# Patient Record
Sex: Female | Born: 1950 | Race: Black or African American | Hispanic: No | Marital: Single | Smoking: Current every day smoker
Health system: Southern US, Community
[De-identification: ages and names within clinical notes are randomized; demographics above are authoritative.]

## PROBLEM LIST (undated history)

## (undated) DIAGNOSIS — E119 Type 2 diabetes mellitus without complications: Secondary | ICD-10-CM

## (undated) DIAGNOSIS — I10 Essential (primary) hypertension: Secondary | ICD-10-CM

## (undated) DIAGNOSIS — E7800 Pure hypercholesterolemia, unspecified: Secondary | ICD-10-CM

## (undated) HISTORY — PX: ABDOMINAL HYSTERECTOMY: SHX81

---

## 2017-02-27 ENCOUNTER — Encounter: Payer: Self-pay | Admitting: Emergency Medicine

## 2017-02-27 ENCOUNTER — Emergency Department (HOSPITAL_BASED_OUTPATIENT_CLINIC_OR_DEPARTMENT_OTHER): Payer: BLUE CROSS/BLUE SHIELD

## 2017-02-27 ENCOUNTER — Emergency Department (HOSPITAL_BASED_OUTPATIENT_CLINIC_OR_DEPARTMENT_OTHER)
Admission: EM | Admit: 2017-02-27 | Discharge: 2017-02-27 | Disposition: A | Discharge status: Home / Self Care | Payer: BLUE CROSS/BLUE SHIELD | Attending: Physician Assistant | Admitting: Physician Assistant

## 2017-02-27 DIAGNOSIS — Z87891 Personal history of nicotine dependence: Secondary | ICD-10-CM | POA: Diagnosis not present

## 2017-02-27 DIAGNOSIS — E119 Type 2 diabetes mellitus without complications: Secondary | ICD-10-CM | POA: Insufficient documentation

## 2017-02-27 DIAGNOSIS — Z7984 Long term (current) use of oral hypoglycemic drugs: Secondary | ICD-10-CM | POA: Insufficient documentation

## 2017-02-27 DIAGNOSIS — Z79899 Other long term (current) drug therapy: Secondary | ICD-10-CM | POA: Diagnosis not present

## 2017-02-27 DIAGNOSIS — M545 Low back pain: Secondary | ICD-10-CM | POA: Diagnosis not present

## 2017-02-27 DIAGNOSIS — M549 Dorsalgia, unspecified: Secondary | ICD-10-CM

## 2017-02-27 DIAGNOSIS — M5442 Lumbago with sciatica, left side: Secondary | ICD-10-CM

## 2017-02-27 DIAGNOSIS — I10 Essential (primary) hypertension: Secondary | ICD-10-CM | POA: Insufficient documentation

## 2017-02-27 HISTORY — DX: Pure hypercholesterolemia, unspecified: E78.00

## 2017-02-27 HISTORY — DX: Essential (primary) hypertension: I10

## 2017-02-27 HISTORY — DX: Type 2 diabetes mellitus without complications: E11.9

## 2017-02-27 MED ORDER — METHYLPREDNISOLONE 4 MG PO TBPK: ORAL_TABLET | ORAL | 0 refills | Status: AC

## 2017-02-27 MED ORDER — OXYCODONE-ACETAMINOPHEN 5-325 MG PO TABS: 1.0000 | ORAL_TABLET | Freq: Four times a day (QID) | ORAL | 0 refills | Status: AC | PRN

## 2017-02-27 NOTE — ED Provider Notes (Signed)
[facility] Provider Note   CSN: [id number] Arrival date & time: 02/27/17  1128     History   Chief Complaint Chief Complaint  Patient presents with  . Back Pain    HPI Paige [name] is a 66 y.o. female.  HPI   Patient is a very pleasant 66 year old female presenting with back pain. Patient has pain across the lower lumbar area. She's reports that all the way across. It's worse with work. Worse with movement. No fevers. Sometimes she reports that shoots down her left leg. Sometimes it does not. Patient is not an IV drug user no fevers no red flags.  Past Medical History:  Diagnosis Date  . Diabetes mellitus without complication (HCC)   . High cholesterol   . Hypertension     There are no active problems to display for this patient.   Past Surgical History:  Procedure Laterality Date  . ABDOMINAL HYSTERECTOMY      OB History    No data available       Home Medications    Prior to Admission medications   Medication Sig Start Date End Date Taking? Authorizing Provider  amLODipine (NORVASC) 5 MG tablet Take 5 mg by mouth daily.   Yes [provider]  atorvastatin (LIPITOR) 40 MG tablet Take 40 mg by mouth daily.   Yes [provider]  metFORMIN (GLUCOPHAGE) 1000 MG tablet Take 1,000 mg by mouth 2 (two) times daily with a meal.   Yes [provider]  potassium chloride (MICRO-K) 10 MEQ CR capsule Take 10 mEq by mouth 2 (two) times daily.   Yes [provider]    Family History No family history on file.  Social History Social History  Substance Use Topics  . Smoking status: Current Every Day Smoker  . Smokeless tobacco: Never Used  . Alcohol use Not on file     Allergies   Aspirin   Review of Systems Review of Systems  Constitutional: Negative for activity change.  Respiratory: Negative for shortness of breath.   Cardiovascular: Negative for chest pain.  Gastrointestinal: Negative for abdominal  pain.  Musculoskeletal: Positive for back pain.     Physical Exam Updated Vital Signs BP 136/81 (BP Location: Left Arm)   Pulse 84   Temp 97.9 F (36.6 C) (Oral)   Resp 20   Ht 5' (1.524 m)   Wt 92.1 kg (203 lb)   SpO2 100%   BMI 39.65 kg/m   Physical Exam  Constitutional: She is oriented to person, place, and time. She appears well-developed and well-nourished.  HENT:  Head: Normocephalic and atraumatic.  Eyes: Right eye exhibits no discharge.  Cardiovascular: Normal rate, regular rhythm and normal heart sounds.   No murmur heard. Pulmonary/Chest: Effort normal and breath sounds normal. She has no wheezes. She has no rales.  Abdominal: Soft. She exhibits no distension. There is no tenderness.  Musculoskeletal:  Bilateral lower extremities with 5 out of 5 strength. No numbness or tingling and steady gait.  Neurological: She is oriented to person, place, and time.  Skin: Skin is warm and dry. She is not diaphoretic.  Psychiatric: She has a normal mood and affect.  Nursing note and vitals reviewed.    ED Treatments / Results  Labs (all labs ordered are listed, but only abnormal results are displayed) Labs Reviewed - No data to display  EKG  EKG Interpretation None       Radiology No results found.  Procedures Procedures (including  critical care time)  Medications Ordered in ED Medications - No data to display   Initial Impression / Assessment and Plan / ED Course  I have reviewed the triage vital signs and the nursing notes.  Pertinent labs & imaging results that were available during my care of the patient were reviewed by me and considered in my medical decision making (see chart for details).     Atraumatic back pain with no red flags. Patient has history of diabetes but is well controlled with diet and metformin. We'll give Medrol Dosepak, follow-up with primary care physician.  Final Clinical Impressions(s) / ED Diagnoses   Final diagnoses:    Back pain    New Prescriptions New Prescriptions   No medications on file     Abelino Derrick, MD 02/27/17 1511

## 2017-02-27 NOTE — Discharge Instructions (Signed)
Please return with any fevers numbness, loss of bowel or bladder continence. Otherwise please follow-up with her primary care physician and sports medicine for help with your back pain.

## 2017-02-27 NOTE — ED Triage Notes (Signed)
Pt reports lower back pain x 2 weeks. ALso reports pain in right 2nd digit. Denies urinary symptoms.

## 2017-07-22 ENCOUNTER — Other Ambulatory Visit: Payer: Self-pay

## 2017-07-22 ENCOUNTER — Ambulatory Visit: Payer: BLUE CROSS/BLUE SHIELD | Attending: Orthopedic Surgery | Admitting: Physical Therapy

## 2017-07-22 ENCOUNTER — Encounter: Payer: Self-pay | Admitting: Physical Therapy

## 2017-07-22 DIAGNOSIS — M545 Low back pain: Secondary | ICD-10-CM | POA: Insufficient documentation

## 2017-07-22 DIAGNOSIS — R29898 Other symptoms and signs involving the musculoskeletal system: Secondary | ICD-10-CM | POA: Insufficient documentation

## 2017-07-22 DIAGNOSIS — M6281 Muscle weakness (generalized): Secondary | ICD-10-CM | POA: Insufficient documentation

## 2017-07-22 NOTE — Therapy (Signed)
[facility] Outpatient Rehabilitation [facility] [address]  [address] [place], [place], [address] Phone: [phone]   Fax:  [phone]  Physical Therapy Evaluation  Patient Details  Name: Paige [name] MRN: [id number] Date of Birth: 18-Feb-1951 Referring Provider: Dr. Ranee Gosselin   Encounter Date: 07/22/2017  PT End of Session - 07/22/17 1010    Visit Number  1    Number of Visits  12    Date for PT Re-Evaluation  09/02/17    PT Start Time  0935    PT Stop Time  1010    PT Time Calculation (min)  35 min    Activity Tolerance  Patient tolerated treatment well    Behavior During Therapy  [facility] for tasks assessed/performed       Past Medical History:  Diagnosis Date  . Diabetes mellitus without complication (HCC)   . High cholesterol   . Hypertension     Past Surgical History:  Procedure Laterality Date  . ABDOMINAL HYSTERECTOMY      There were no vitals filed for this visit.   Subjective Assessment - 07/22/17 0937    Subjective  Pateint reporting a fall on 06/20/17 - was at work, doesn't recall mechanism of fall. Reports difficulty getting up in the morning due to pain. Reports a history of herniated disc. Feels like B LE are really weak. Report B N&T of B LE - goes from hip to toes. Reports difficulty with steps (4) to get inside as well as bending over. Feels like balance may be a little off.     Pertinent History  HTN, DM    Diagnostic tests  xray and MRI - no fracture per patient, otherwise unsure    Patient Stated Goals  increase LE strength, reduce further falls    Currently in Pain?  Yes    Pain Score  7     Pain Location  Back and B hips    Pain Orientation  Right;Left;Lower    Pain Descriptors / Indicators  Discomfort    Pain Type  Acute pain    Pain Onset  More than a month ago    Pain Frequency  Constant    Aggravating Factors   getting up in the morning, prolonged sitting and standing    Pain Relieving Factors  pain medicine          [facility] PT Assessment - 07/22/17 0943      Assessment   Medical Diagnosis  low back pain    Referring Provider  Dr. Ranee Gosselin    Onset Date/Surgical Date  06/20/17    Next MD Visit  08/15/17    Prior Therapy  no      Precautions   Precautions  None      Restrictions   Weight Bearing Restrictions  No      Balance Screen   Has the patient fallen in the past 6 months  Yes    How many times?  1    Has the patient had a decrease in activity level because of a fear of falling?   Yes    Is the patient reluctant to leave their home because of a fear of falling?   No      Home Environment   Living Environment  Private residence    Type of Home  House    Home Access  Stairs to enter    [employer] of Steps  4    Entrance Stairs-Rails  Right    Home Layout  One level      Prior Function   Level of Independence  Independent    Vocation  Full time employment    Vocation Requirements  lift 50#, standing for 8 hours, repetitive motion      Cognition   Overall Cognitive Status  Within Functional Limits for tasks assessed      Observation/Other Assessments   Focus on Therapeutic Outcomes (FOTO)   Lumbar Spine: 31 (69% limited, predicted 47% limited)      Sensation   Light Touch  Appears Intact      Coordination   Gross Motor Movements are Fluid and Coordinated  Yes      Posture/Postural Control   Posture/Postural Control  Postural limitations    Postural Limitations  Rounded Shoulders;Forward head      ROM / Strength   AROM / PROM / Strength  AROM;Strength      AROM   AROM Assessment Site  Lumbar    Lumbar Flexion  fingertips to mid shin - back pain/pulling    Lumbar Extension  WNL    Lumbar - Right Side Bend  fingertip above joint line - B LBP    Lumbar - Left Side Bend  fingertip above joint line - B LBP    Lumbar - Right Rotation  25% limited    Lumbar - Left Rotation  25% limited      Strength   Overall Strength Comments  B LE grossly 4-/5     Strength Assessment Site  Hip;Knee      Flexibility   Soft Tissue Assessment /Muscle Length  yes    Hamstrings  B tightness      Palpation   Palpation comment  diffusely non-tender             Objective measurements completed on examination: See above findings.      [facility] Adult PT Treatment/Exercise - 07/22/17 0943      Exercises   Exercises  Lumbar      Lumbar Exercises: Stretches   Passive Hamstring Stretch  Right;Left;3 reps;30 seconds      Lumbar Exercises: Supine   Bridge  10 reps    Straight Leg Raise  10 reps    Straight Leg Raises Limitations  bilateral             PT Education - 07/22/17 1011    Education provided  Yes    Education Details  exam findings, POC, HEP    Person(s) Educated  Patient    Methods  Explanation;Demonstration;Handout    Comprehension  Verbalized understanding;Returned demonstration       PT Short Term Goals - 07/22/17 1314      PT SHORT TERM GOAL #1   Title  patient to be independent with initial HEP    Status  New    Target Date  08/12/17        PT Long Term Goals - 07/22/17 1315      PT LONG TERM GOAL #1   Title  Patient to be independent with advanced HEP    Status  New    Target Date  09/02/17      PT LONG TERM GOAL #2   Title  patient to improve lumbar AROM to WNL without pain limiting    Status  New    Target Date  09/02/17      PT LONG TERM GOAL #3   Title  patient to improve B LE  strength to >/= 4+/5    Status  New    Target Date  09/02/17      PT LONG TERM GOAL #4   Title  patient to demonstrate good posture and body mechanics as related to her daily activities    Status  New    Target Date  09/02/17      PT LONG TERM GOAL #5   Title  patient to report ability to perform ADLs and light household chores without pain limiting    Status  New    Target Date  09/02/17             Plan - 07/22/17 1255    Clinical Impression Statement  Ms. Paige [name] is a pleasant 67 y/o female presenting  to OPPT today regarding primary complaints of low back pain + B hip pain sustained after a fall at work. Patient today with reduced strength at B proximal hips as well reduced flexibility at B HS. Patient with a fear of falling with PT to assess balance at next visit. Patient given initial HEP for gentle stretching and strengthening with good tolerance and carryover. Patient to benefit from skilled PT intervention to address the above listed deficits to improve pain and functional mobility.     Clinical Presentation  Stable    Clinical Decision Making  Low    Rehab Potential  Good    PT Frequency  2x / week    PT Duration  6 weeks    PT Treatment/Interventions  ADLs/Self Care Home Management;Cryotherapy;Electrical Stimulation;Moist Heat;Traction;Therapeutic exercise;Therapeutic activities;Functional mobility training;Stair training;Ultrasound;Balance training;Neuromuscular re-education;Patient/family education;Manual techniques;Vasopneumatic Device;Taping;Dry needling;Passive range of motion    Consulted and Agree with Plan of Care  Patient       Patient will benefit from skilled therapeutic intervention in order to improve the following deficits and impairments:  Pain, Decreased strength, Decreased activity tolerance, Difficulty walking, Decreased mobility, Decreased range of motion  Visit Diagnosis: Acute bilateral low back pain, with sciatica presence unspecified  Muscle weakness (generalized)  Other symptoms and signs involving the musculoskeletal system     Problem List There are no active problems to display for this patient.   Kipp LaurenceStephanie R Dashae Wilcher, PT, DPT 07/22/17 1:21 PM   [facility] [address]  [address] [place], [place], [address] Phone: [phone]   Fax:  [phone]  Name: Paige [name] MRN: [id number] Date of Birth: 05/18/1951

## 2017-07-22 NOTE — Patient Instructions (Signed)
Hamstring Step 2   Left foot relaxed, knee straight, other leg bent, foot flat. Raise straight leg further upward to maximal range. Hold _30__ seconds. Relax leg completely down. Repeat _3__ times.  Strengthening: Straight Leg Raise (Phase 1)   Tighten muscles on front of right thigh, then lift leg __~8__ inches from surface, keeping knee locked.  Repeat _15___ times per set. Do __2__ sets per session.   Bridge   Lie back, legs bent. Inhale, pressing hips up. Keeping ribs in, lengthen lower back. Exhale, rolling down along spine from top. Repeat __15__ times. Do _2__ sessions per day.  Sleeping on Back  Place pillow under knees. A pillow with cervical support and a roll around waist are also helpful. Copyright  VHI. All rights reserved.  Sleeping on Side Place pillow between knees. Use cervical support under neck and a roll around waist as needed. Copyright  VHI. All rights reserved.   Sleeping on Stomach   If this is the only desirable sleeping position, place pillow under lower legs, and under stomach or chest as needed.  Posture - Sitting   Sit upright, head facing forward. Try using a roll to support lower back. Keep shoulders relaxed, and avoid rounded back. Keep hips level with knees. Avoid crossing legs for long periods. Stand to Sit / Sit to Stand   To sit: Bend knees to lower self onto front edge of chair, then scoot back on seat. To stand: Reverse sequence by placing one foot forward, and scoot to front of seat. Use rocking motion to stand up.   Work Height and Reach  Ideal work height is no more than 2 to 4 inches below elbow level when standing, and at elbow level when sitting. Reaching should be limited to arm's length, with elbows slightly bent.  Bending  Bend at hips and knees, not back. Keep feet shoulder-width apart.    Posture - Standing   Good posture is important. Avoid slouching and forward head thrust. Maintain curve in low back and align  ears over shoul- ders, hips over ankles.  Alternating Positions   Alternate tasks and change positions frequently to reduce fatigue and muscle tension. Take rest breaks. Computer Work   Position work to Art gallery managerface forward. Use proper work and seat height. Keep shoulders back and down, wrists straight, and elbows at right angles. Use chair that provides full back support. Add footrest and lumbar roll as needed.  Getting Into / Out of Car  Lower self onto seat, scoot back, then bring in one leg at a time. Reverse sequence to get out.  Dressing  Lie on back to pull socks or slacks over feet, or sit and bend leg while keeping back straight.    Housework - Sink  Place one foot on ledge of cabinet under sink when standing at sink for prolonged periods.   Pushing / Pulling  Pushing is preferable to pulling. Keep back in proper alignment, and use leg muscles to do the work.  Deep Squat   Squat and lift with both arms held against upper trunk. Tighten stomach muscles without holding breath. Use smooth movements to avoid jerking.  Avoid Twisting   Avoid twisting or bending back. Pivot around using foot movements, and bend at knees if needed when reaching for articles.  Carrying Luggage   Distribute weight evenly on both sides. Use a cart whenever possible. Do not twist trunk. Move body as a unit.   Lifting Principles .Maintain proper posture and head alignment. .Marland Kitchen  Slide object as close as possible before lifting. .Move obstacles out of the way. .Test before lifting; ask for help if too heavy. .Tighten stomach muscles without holding breath. .Use smooth movements; do not jerk. .Use legs to do the work, and pivot with feet. .Distribute the work load symmetrically and close to the center of trunk. .Push instead of pull whenever possible.   Ask For Help   Ask for help and delegate to others when possible. Coordinate your movements when lifting together, and maintain the low back  curve.  Log Roll   Lying on back, bend left knee and place left arm across chest. Roll all in one movement to the right. Reverse to roll to the left. Always move as one unit. Housework - Sweeping  Use long-handled equipment to avoid stooping.   Housework - Wiping  Position yourself as close as possible to reach work surface. Avoid straining your back.  Laundry - Unloading Wash   To unload small items at bottom of washer, lift leg opposite to arm being used to reach.  Gardening - Raking  Move close to area to be raked. Use arm movements to do the work. Keep back straight and avoid twisting.   Cart  When reaching into cart with one arm, lift opposite leg to keep back straight.   Getting Into / Out of Bed  Lower self to lie down on one side by raising legs and lowering head at the same time. Use arms to assist moving without twisting. Bend both knees to roll onto back if desired. To sit up, start from lying on side, and use same move-ments in reverse. Housework - Vacuuming  Hold the vacuum with arm held at side. Step back and forth to move it, keeping head up. Avoid twisting.   Laundry - Armed forces training and education officer so that bending and twisting can be avoided.   Laundry - Unloading Dryer  Squat down to reach into clothes dryer or use a reacher.  Gardening - Weeding / Psychiatric nurse or Kneel. Knee pads may be helpful.

## 2017-07-25 ENCOUNTER — Ambulatory Visit: Payer: BLUE CROSS/BLUE SHIELD

## 2017-07-25 DIAGNOSIS — M545 Low back pain: Secondary | ICD-10-CM | POA: Diagnosis not present

## 2017-07-25 DIAGNOSIS — R29898 Other symptoms and signs involving the musculoskeletal system: Secondary | ICD-10-CM

## 2017-07-25 DIAGNOSIS — M6281 Muscle weakness (generalized): Secondary | ICD-10-CM

## 2017-07-25 NOTE — Therapy (Signed)
[facility] Outpatient Rehabilitation [facility] [address]  [address] [place], [place], [address] Phone: [phone]   Fax:  [phone]  Physical Therapy Treatment  Patient Details  Name: Raymond Azure MRN: [id number] Date of Birth: 09-Oct-1950 Referring Provider: Dr. Ranee Gosselin   Encounter Date: 07/25/2017  PT End of Session - 07/25/17 1619    Visit Number  2    Number of Visits  12    Date for PT Re-Evaluation  09/02/17    PT Start Time  1615    PT Stop Time  1658    PT Time Calculation (min)  43 min    Activity Tolerance  Patient tolerated treatment well    Behavior During Therapy  [facility] for tasks assessed/performed       Past Medical History:  Diagnosis Date  . Diabetes mellitus without complication (HCC)   . High cholesterol   . Hypertension     Past Surgical History:  Procedure Laterality Date  . ABDOMINAL HYSTERECTOMY      There were no vitals filed for this visit.  Subjective Assessment - 07/25/17 1618    Subjective  Pt. doing well today however has not performed HEP.      Pertinent History  HTN, DM    Diagnostic tests  xray and MRI - no fracture per patient, otherwise unsure    Patient Stated Goals  increase LE strength, reduce further falls    Currently in Pain?  No/denies    Pain Score  0-No pain                      [facility] Adult PT Treatment/Exercise - 07/25/17 1629      Lumbar Exercises: Stretches   Passive Hamstring Stretch  Right;Left;30 seconds;3 reps    Passive Hamstring Stretch Limitations  cueing required for LE relaxation       Lumbar Exercises: Aerobic   Nustep  Lvl 4, 6 min       Lumbar Exercises: Machines for Strengthening   Other Lumbar Machine Exercise  BATCA low row 10# x 15 reps       Lumbar Exercises: Standing   Functional Squats  10 reps;3 seconds    Functional Squats Limitations  counter       Lumbar Exercises: Supine   Ab Set  10 reps;5 seconds    AB Set Limitations  cueing required  for consistent breathing pattern     Clam  10 reps;3 seconds    Clam Limitations  Hooklying with alternating hip abd/ER with red looped TB at knees     Bent Knee Raise  10 reps;3 seconds    Bent Knee Raise Limitations  red TB at knees     Bridge  10 reps cueing required for full ROM     Straight Leg Raise  10 reps    Straight Leg Raises Limitations  bilateral    Other Supine Lumbar Exercises  B straight leg bridge x 10 reps       Lumbar Exercises: Sidelying   Clam  Both;10 reps               PT Short Term Goals - 07/25/17 1628      PT SHORT TERM GOAL #1   Title  patient to be independent with initial HEP    Status  On-going        PT Long Term Goals - 07/25/17 1636      PT LONG TERM GOAL #  1   Title  Patient to be independent with advanced HEP    Status  On-going      PT LONG TERM GOAL #2   Title  patient to improve lumbar AROM to WNL without pain limiting    Status  On-going      PT LONG TERM GOAL #3   Title  patient to improve B LE strength to >/= 4+/5    Status  On-going      PT LONG TERM GOAL #4   Title  patient to demonstrate good posture and body mechanics as related to her daily activities    Status  On-going      PT LONG TERM GOAL #5   Title  patient to report ability to perform ADLs and light household chores without pain limiting    Status  On-going            Plan - 07/25/17 1631    Clinical Impression Statement  Staphany reporting she has not yet attempted HEP and encouraged to perform HEP on days not present in therapy.  Tolerated all lumbopelvic strengthening and LE stretching activities in treatment well today.  Did require cueing for consistent breathing pattern and proper pacing with therex today.      PT Treatment/Interventions  ADLs/Self Care Home Management;Cryotherapy;Electrical Stimulation;Moist Heat;Traction;Therapeutic exercise;Therapeutic activities;Functional mobility training;Stair training;Ultrasound;Balance  training;Neuromuscular re-education;Patient/family education;Manual techniques;Vasopneumatic Device;Taping;Dry needling;Passive range of motion    Consulted and Agree with Plan of Care  Patient       Patient will benefit from skilled therapeutic intervention in order to improve the following deficits and impairments:  Pain, Decreased strength, Decreased activity tolerance, Difficulty walking, Decreased mobility, Decreased range of motion  Visit Diagnosis: Acute bilateral low back pain, with sciatica presence unspecified  Muscle weakness (generalized)  Other symptoms and signs involving the musculoskeletal system     Problem List There are no active problems to display for this patient.   Kermit BaloMicah Clytie Shetley, PTA 07/25/17 6:08 PM  [facility] [address]  [address] [place], [place], [address] Phone: [phone]   Fax:  [phone]  Name: Otila BackMaxine Bertha MRN: [id number] Date of Birth: 12/17/1950

## 2017-07-27 ENCOUNTER — Ambulatory Visit: Payer: BLUE CROSS/BLUE SHIELD

## 2017-07-27 DIAGNOSIS — M545 Low back pain: Secondary | ICD-10-CM

## 2017-07-27 DIAGNOSIS — M6281 Muscle weakness (generalized): Secondary | ICD-10-CM

## 2017-07-27 DIAGNOSIS — R29898 Other symptoms and signs involving the musculoskeletal system: Secondary | ICD-10-CM

## 2017-07-27 NOTE — Therapy (Signed)
[facility] [place] [address]  [address] [place], [place], [address] Phone: [phone]   Fax:  [phone]  Physical Therapy Treatment  Patient Details  Name: Paige [name] MRN: [id number] Date of Birth: 03/29/1951 Referring Provider: Dr. Ranee Gosselinonald Gioffre   Encounter Date: 07/27/2017  PT End of Session - 07/27/17 0923    Visit Number  3    Number of Visits  12    Date for PT Re-Evaluation  09/02/17    PT Start Time  0919    PT Stop Time  1003    PT Time Calculation (min)  44 min    Activity Tolerance  Patient tolerated treatment well    Behavior During Therapy  [facility] for tasks assessed/performed       Past Medical History:  Diagnosis Date  . Diabetes mellitus without complication (HCC)   . High cholesterol   . Hypertension     Past Surgical History:  Procedure Laterality Date  . ABDOMINAL HYSTERECTOMY      There were no vitals filed for this visit.  Subjective Assessment - 07/27/17 0921    Subjective  Pt. reporting some muscular soreness following last visit.      Pertinent History  HTN, DM    Diagnostic tests  xray and MRI - no fracture per patient, otherwise unsure    Patient Stated Goals  increase LE strength, reduce further falls    Currently in Pain?  No/denies    Pain Score  0-No pain    Multiple Pain Sites  No                      [facility] Adult PT Treatment/Exercise - 07/27/17 0928      Self-Care   Self-Care  Other Self-Care Comments    Other Self-Care Comments   Reviewed strategies for bending to floor to use more of a squatting technique rather than straight leg bending to reduce lumbar strain as pt. notes some lower back pain when bending to floor      Lumbar Exercises: Aerobic   Nustep  Lvl 5, 6 min       Lumbar Exercises: Machines for Strengthening   Other Lumbar Machine Exercise  BATCA single arm 10# x 10 reps each arm  cues required to maintain trunk neutral     Other Lumbar Machine  Exercise  BATCA straight arm pull down in standing 5# x 10 reps cues required to maintain neutral pelvis and for pacing       Lumbar Exercises: Standing   Functional Squats  15 reps;3 seconds Cues required to avoid excessive depth     Functional Squats Limitations  counter     Shoulder Extension  Both;15 reps;Theraband;Strengthening    Theraband Level (Shoulder Extension)  Level 2 (Red)      Lumbar Exercises: Seated   Long Arc Quad on Coventry Health CareBall  Right;Left;10 reps Cues required to maintain neutral pelvis     LAQ on Ball Limitations  seated on green p-ball     Hip Flexion on Ball  Right;Left;10 reps Cues required to maintain neutral pelvis     Hip Flexion on Ball Limitations  seated on green p-ball     Other Seated Lumbar Exercises  B pallof press with red TB seated on green p-ball x 10 reps       Lumbar Exercises: Supine   Ab Set  10 reps;5 seconds    AB Set Limitations  cueing required for  consistent breathing pattern     Clam  3 seconds;15 reps    Clam Limitations  Hooklying with alternating hip abd/ER with red looped TB at knees     Bent Knee Raise  3 seconds;15 reps    Bent Knee Raise Limitations  red TB at knees     Straight Leg Raise  15 reps;3 seconds Pt. has not been performing due to confusion with HEP    Straight Leg Raises Limitations  bilateral    Isometric Hip Flexion  10 reps;3 seconds Cues required for proper technique     Isometric Hip Flexion Limitations  single LE with opposite LE resting on table       Lumbar Exercises: Sidelying   Clam  Both;10 reps    Clam Limitations  red TB  pt. able to perform with good control and only mild fatigue              PT Education - 07/27/17 1024    Education provided  Yes    Education Details  abdominal bracing, clam shell with red looped TB issued to pt., Hooklying clam shell with red TB, counter squat     Person(s) Educated  Patient    Methods  Explanation;Demonstration;Verbal cues;Handout    Comprehension  Verbalized  understanding;Returned demonstration;Verbal cues required;Need further instruction       PT Short Term Goals - 07/25/17 1628      PT SHORT TERM GOAL #1   Title  patient to be independent with initial HEP    Status  On-going        PT Long Term Goals - 07/25/17 1636      PT LONG TERM GOAL #1   Title  Patient to be independent with advanced HEP    Status  On-going      PT LONG TERM GOAL #2   Title  patient to improve lumbar AROM to WNL without pain limiting    Status  On-going      PT LONG TERM GOAL #3   Title  patient to improve B LE strength to >/= 4+/5    Status  On-going      PT LONG TERM GOAL #4   Title  patient to demonstrate good posture and body mechanics as related to her daily activities    Status  On-going      PT LONG TERM GOAL #5   Title  patient to report ability to perform ADLs and light household chores without pain limiting    Status  On-going            Plan - 07/27/17 1610    Clinical Impression Statement  Pt. doing well today reporting some muscular soreness for remainder of day following last visit.  Tolerated progression of lumbopelvic strengthening and core stability seated on p-ball.  Cues required for proper pacing and abdominal activation today with therex.  Reviewed proper squatting form to reduce lumbar stain with bending to floor as pt. noting some pain with this movement.  HEP updated today and will plan to monitor response at upcoming visit.    PT Treatment/Interventions  ADLs/Self Care Home Management;Cryotherapy;Electrical Stimulation;Moist Heat;Traction;Therapeutic exercise;Therapeutic activities;Functional mobility training;Stair training;Ultrasound;Balance training;Neuromuscular re-education;Patient/family education;Manual techniques;Vasopneumatic Device;Taping;Dry needling;Passive range of motion    Consulted and Agree with Plan of Care  Patient       Patient will benefit from skilled therapeutic intervention in order to improve the  following deficits and impairments:  Pain, Decreased strength, Decreased activity tolerance, Difficulty walking,  Decreased mobility, Decreased range of motion  Visit Diagnosis: Acute bilateral low back pain, with sciatica presence unspecified  Muscle weakness (generalized)  Other symptoms and signs involving the musculoskeletal system     Problem List There are no active problems to display for this patient.   Kermit Balo, PTA 07/27/17 10:25 AM  [facility] [address]  [address] [place], [place], [address] Phone: [phone]   Fax:  [phone]  Name: Paige [name] MRN: [id number] Date of Birth: Apr 01, 1951

## 2017-08-01 ENCOUNTER — Encounter: Payer: Self-pay | Admitting: Physical Therapy

## 2017-08-01 ENCOUNTER — Ambulatory Visit: Payer: BLUE CROSS/BLUE SHIELD | Admitting: Physical Therapy

## 2017-08-01 DIAGNOSIS — M545 Low back pain: Secondary | ICD-10-CM

## 2017-08-01 DIAGNOSIS — R29898 Other symptoms and signs involving the musculoskeletal system: Secondary | ICD-10-CM

## 2017-08-01 DIAGNOSIS — M6281 Muscle weakness (generalized): Secondary | ICD-10-CM

## 2017-08-01 NOTE — Therapy (Signed)
[facility] [address]  [address] [place], [place], [address] Phone: [phone]   Fax:  [phone]  Physical Therapy Treatment  Patient Details  Name: Paige BackMaxine [name] MRN: [id number] Date of Birth: 07/08/1950 Referring Provider: Dr. Ranee Gosselinonald Gioffre   Encounter Date: 08/01/2017  PT End of Session - 08/01/17 0945    Visit Number  4    Number of Visits  12    Date for PT Re-Evaluation  09/02/17    PT Start Time  0941    PT Stop Time  1022    PT Time Calculation (min)  41 min    Activity Tolerance  Patient tolerated treatment well    Behavior During Therapy  [facility] for tasks assessed/performed       Past Medical History:  Diagnosis Date  . Diabetes mellitus without complication (HCC)   . High cholesterol   . Hypertension     Past Surgical History:  Procedure Laterality Date  . ABDOMINAL HYSTERECTOMY      There were no vitals filed for this visit.  Subjective Assessment - 08/01/17 0944    Subjective  low [name] is tender; having some L shoulder pain    Pertinent History  HTN, DM    Diagnostic tests  xray and MRI - no fracture per patient, otherwise unsure    Patient Stated Goals  increase LE strength, reduce further falls    Currently in Pain?  Yes    Pain Score  4     Pain Location  [name]    Pain Orientation  Right;Left;Lower    Pain Descriptors / Indicators  Tender    Pain Type  Acute pain                      [facility] Adult PT Treatment/Exercise - 08/01/17 0946      Lumbar Exercises: Stretches   Other Lumbar Stretch Exercise  lumbar - ball rollouts- fwd/R/L 5 x 5 sec each      Lumbar Exercises: Aerobic   Nustep  L4 x 6 min      Lumbar Exercises: Machines for Strengthening   Cybex Knee Extension  B LE - 15# x 15    Cybex Knee Flexion  B LE - 15# x 15      Lumbar Exercises: Standing   Functional Squats  10 reps;3 seconds TRX    Wall Slides  -- 12 x 3 sec    Other Standing Lumbar Exercises  3  way hip kickers - B LE - red tband x 12 reps each      Lumbar Exercises: Supine   Bridge  10 reps;3 seconds red tband    Bridge with clamshell  10 reps red tband - alternating clamshell    Isometric Hip Flexion  10 reps;5 seconds feet resting on peanut ball               PT Short Term Goals - 07/25/17 1628      PT SHORT TERM GOAL #1   Title  patient to be independent with initial HEP    Status  On-going        PT Long Term Goals - 07/25/17 1636      PT LONG TERM GOAL #1   Title  Patient to be independent with advanced HEP    Status  On-going      PT LONG TERM GOAL #2   Title  patient to improve lumbar AROM  to WNL without pain limiting    Status  On-going      PT LONG TERM GOAL #3   Title  patient to improve B LE strength to >/= 4+/5    Status  On-going      PT LONG TERM GOAL #4   Title  patient to demonstrate good posture and body mechanics as related to her daily activities    Status  On-going      PT LONG TERM GOAL #5   Title  patient to report ability to perform ADLs and light household chores without pain limiting    Status  On-going            Plan - 08/01/17 0945    Clinical Impression Statement  Patient tolerable to all strengthening today. Does require VC throughout session to ensure good motor patterns as she often compensated during ther ex (forward/lateral trunk flexion) to produce desired motions. Will continue to progress towards goals.     PT Treatment/Interventions  ADLs/Self Care Home Management;Cryotherapy;Electrical Stimulation;Moist Heat;Traction;Therapeutic exercise;Therapeutic activities;Functional mobility training;Stair training;Ultrasound;Balance training;Neuromuscular re-education;Patient/family education;Manual techniques;Vasopneumatic Device;Taping;Dry needling;Passive range of motion    Consulted and Agree with Plan of Care  Patient       Patient will benefit from skilled therapeutic intervention in order to improve the  following deficits and impairments:  Pain, Decreased strength, Decreased activity tolerance, Difficulty walking, Decreased mobility, Decreased range of motion  Visit Diagnosis: Acute bilateral low [name] pain, with sciatica presence unspecified  Muscle weakness (generalized)  Other symptoms and signs involving the musculoskeletal system     Problem List There are no active problems to display for this patient.    Kipp Laurence, PT, DPT 08/01/17 10:27 AM   [facility] [address]  [address] [place], [place], [address] Phone: [phone]   Fax:  [phone]  Name: Paige [name] MRN: [id number] Date of Birth: 01-Oct-1950

## 2017-08-03 ENCOUNTER — Ambulatory Visit: Payer: BLUE CROSS/BLUE SHIELD

## 2017-08-03 DIAGNOSIS — R29898 Other symptoms and signs involving the musculoskeletal system: Secondary | ICD-10-CM

## 2017-08-03 DIAGNOSIS — M6281 Muscle weakness (generalized): Secondary | ICD-10-CM

## 2017-08-03 DIAGNOSIS — M545 Low back pain: Secondary | ICD-10-CM

## 2017-08-03 NOTE — Therapy (Signed)
[facility] [address]  [address] [place], [place], [address] Phone: [phone]   Fax:  [phone]  Physical Therapy Treatment  Patient Details  Name: Paige [name] MRN: [id number] Date of Birth: 03/23/1951 Referring Provider: Dr. Ranee Gosselinonald Gioffre   Encounter Date: 08/03/2017  PT End of Session - 08/03/17 0936    Visit Number  5    Number of Visits  12    Date for PT Re-Evaluation  09/02/17    PT Start Time  0930    PT Stop Time  1010    PT Time Calculation (min)  40 min    Activity Tolerance  Patient tolerated treatment well    Behavior During Therapy  [facility] for tasks assessed/performed       Past Medical History:  Diagnosis Date  . Diabetes mellitus without complication (HCC)   . High cholesterol   . Hypertension     Past Surgical History:  Procedure Laterality Date  . ABDOMINAL HYSTERECTOMY      There were no vitals filed for this visit.  Subjective Assessment - 08/03/17 0933    Subjective  Pt. reporting L shoulder is stil bothering her intermittently however denies pain today.    Pertinent History  HTN, DM    Diagnostic tests  xray and MRI - no fracture per patient, otherwise unsure    Patient Stated Goals  increase LE strength, reduce further falls    Currently in Pain?  No/denies    Pain Score  0-No pain    Multiple Pain Sites  No                      [facility] Adult PT Treatment/Exercise - 08/03/17 0939      Lumbar Exercises: Stretches   Lower Trunk Rotation  5 reps;10 seconds      Lumbar Exercises: Aerobic   Nustep  L5x 6 min      Lumbar Exercises: Machines for Strengthening   Cybex Knee Flexion  B LE - 20# x 15      Lumbar Exercises: Standing   Wall Slides  15 reps;3 seconds    Wall Slides Limitations  leaning on orange p-ball on wall     Row  15 reps;Strengthening;Theraband    Theraband Level (Row)  Level 2 (Red)    Shoulder Extension  Both;15 reps;Theraband;Strengthening    Theraband Level (Shoulder Extension)  Level 2 (Red)    Other Standing Lumbar Exercises  B forward step up to 6" step x 10 reps       Lumbar Exercises: Supine   Isometric Hip Flexion  5 seconds;15 reps some cueing required for proper breathing pattern.      Isometric Hip Flexion Limitations  opposite LE resting on mat table       Lumbar Exercises: Sidelying   Other Sidelying Lumbar Exercises  B sidelying open book stretch 5" x 5 reps each side                PT Short Term Goals - 08/03/17 0940      PT SHORT TERM GOAL #1   Title  patient to be independent with initial HEP    Status  Achieved        PT Long Term Goals - 07/25/17 1636      PT LONG TERM GOAL #1   Title  Patient to be independent with advanced HEP    Status  On-going  PT LONG TERM GOAL #2   Title  patient to improve lumbar AROM to WNL without pain limiting    Status  On-going      PT LONG TERM GOAL #3   Title  patient to improve B LE strength to >/= 4+/5    Status  On-going      PT LONG TERM GOAL #4   Title  patient to demonstrate good posture and body mechanics as related to her daily activities    Status  On-going      PT LONG TERM GOAL #5   Title  patient to report ability to perform ADLs and light household chores without pain limiting    Status  On-going            Plan - 08/03/17 0936    Clinical Impression Statement  Paige [name] doing well today aside from reporting intermittent shoulder pain since falling.  Feels therapy is helping to lower back pain levels.  Tolerated all lumbopelvic and mid back strengthening activities in treatment well today.  Does require some cueing for proper breathing patterns with abdominal strengthening activities in treatment.  Will continue to progress toward goals in coming visits.      PT Treatment/Interventions  ADLs/Self Care Home Management;Cryotherapy;Electrical Stimulation;Moist Heat;Traction;Therapeutic exercise;Therapeutic activities;Functional  mobility training;Stair training;Ultrasound;Balance training;Neuromuscular re-education;Patient/family education;Manual techniques;Vasopneumatic Device;Taping;Dry needling;Passive range of motion    Consulted and Agree with Plan of Care  Patient       Patient will benefit from skilled therapeutic intervention in order to improve the following deficits and impairments:  Pain, Decreased strength, Decreased activity tolerance, Difficulty walking, Decreased mobility, Decreased range of motion  Visit Diagnosis: Acute bilateral low back pain, with sciatica presence unspecified  Muscle weakness (generalized)  Other symptoms and signs involving the musculoskeletal system     Problem List There are no active problems to display for this patient.   Kermit Balo, PTA 08/03/17 12:23 PM  [facility] [address]  [address] [place], [place], [address] Phone: [phone]   Fax:  [phone]  Name: Paige [name] MRN: [id number] Date of Birth: February 29, 1951

## 2017-08-08 ENCOUNTER — Ambulatory Visit: Payer: BLUE CROSS/BLUE SHIELD | Attending: Orthopedic Surgery

## 2017-08-08 DIAGNOSIS — M545 Low back pain: Secondary | ICD-10-CM | POA: Insufficient documentation

## 2017-08-08 DIAGNOSIS — R29898 Other symptoms and signs involving the musculoskeletal system: Secondary | ICD-10-CM | POA: Insufficient documentation

## 2017-08-08 DIAGNOSIS — M6281 Muscle weakness (generalized): Secondary | ICD-10-CM | POA: Diagnosis present

## 2017-08-08 NOTE — Therapy (Signed)
[facility] [address]  [address] [place], [place], [address] Phone: [phone]   Fax:  [phone]  Physical Therapy Treatment  Patient Details  Name: Paige [name] MRN: [id number] Date of Birth: 1951-01-25 Referring Provider: Dr. Ranee Gosselin   Encounter Date: 08/08/2017  PT End of Session - 08/08/17 0932    Visit Number  6    Number of Visits  12    Date for PT Re-Evaluation  09/02/17    PT Start Time  0930    PT Stop Time  1009    PT Time Calculation (min)  39 min    Activity Tolerance  Patient tolerated treatment well    Behavior During Therapy  [facility] for tasks assessed/performed       Past Medical History:  Diagnosis Date  . Diabetes mellitus without complication (HCC)   . High cholesterol   . Hypertension     Past Surgical History:  Procedure Laterality Date  . ABDOMINAL HYSTERECTOMY      There were no vitals filed for this visit.  Subjective Assessment - 08/08/17 0933    Subjective  Pt. reporting she felt good after last visit.  Has been having some R posterior hip pain in mornings which subsides as day proresses.  Pt. was able to walk four miles two different days last weekend without issue.      Pertinent History  HTN, DM    Diagnostic tests  xray and MRI - no fracture per patient, otherwise unsure    Patient Stated Goals  increase LE strength, reduce further falls    Currently in Pain?  No/denies    Pain Score  0-No pain    Multiple Pain Sites  No                      [facility] Adult PT Treatment/Exercise - 08/08/17 0939      Self-Care   Self-Care  Other Self-Care Comments      Lumbar Exercises: Stretches   Other Lumbar Stretch Exercise  3-way lumbar ball rollouts seated on table x 20 sec       Lumbar Exercises: Aerobic   Nustep  L6 x 6 min      Lumbar Exercises: Machines for Strengthening   Cybex Knee Extension  B LE's 20# x 10 reps     Cybex Knee Flexion  B LE's 25# x 10  reps     Leg Press  B LE's 20# x 15 reps       Lumbar Exercises: Standing   Functional Squats  3 seconds;15 reps    Functional Squats Limitations  TRX min cueing to maintain proper positioning     Wall Slides  15 reps;3 seconds    Wall Slides Limitations  leaning on orange p-ball on wall     Other Standing Lumbar Exercises  Side stepping, monster walk with red TB at ankles 2 x 30 ft       Lumbar Exercises: Sidelying   Other Sidelying Lumbar Exercises  B sidelying open book stretch 5" x 5 reps each side                PT Short Term Goals - 08/03/17 0940      PT SHORT TERM GOAL #1   Title  patient to be independent with initial HEP    Status  Achieved        PT Long Term Goals - 07/25/17 1636  PT LONG TERM GOAL #1   Title  Patient to be independent with advanced HEP    Status  On-going      PT LONG TERM GOAL #2   Title  patient to improve lumbar AROM to WNL without pain limiting    Status  On-going      PT LONG TERM GOAL #3   Title  patient to improve B LE strength to >/= 4+/5    Status  On-going      PT LONG TERM GOAL #4   Title  patient to demonstrate good posture and body mechanics as related to her daily activities    Status  On-going      PT LONG TERM GOAL #5   Title  patient to report ability to perform ADLs and light household chores without pain limiting    Status  On-going            Plan - 08/08/17 0932    Clinical Impression Statement  Pt. reporting she was able to walk four miles x two days last week without back pain.  Feels her pain levels have improved ~ 80% since starting therapy.  Tolerated progression of machine strengthening activities today well.  Only requiring min cueing now for proper technique with therex activities.  Progressing well toward goals.      PT Treatment/Interventions  ADLs/Self Care Home Management;Cryotherapy;Electrical Stimulation;Moist Heat;Traction;Therapeutic exercise;Therapeutic activities;Functional mobility  training;Stair training;Ultrasound;Balance training;Neuromuscular re-education;Patient/family education;Manual techniques;Vasopneumatic Device;Taping;Dry needling;Passive range of motion    Consulted and Agree with Plan of Care  Patient       Patient will benefit from skilled therapeutic intervention in order to improve the following deficits and impairments:  Pain, Decreased strength, Decreased activity tolerance, Difficulty walking, Decreased mobility, Decreased range of motion  Visit Diagnosis: Acute bilateral low back pain, with sciatica presence unspecified  Muscle weakness (generalized)  Other symptoms and signs involving the musculoskeletal system     Problem List There are no active problems to display for this patient.   Kermit BaloMicah Keil Pickering, PTA 08/08/17 12:27 PM  [facility] [address]  [address] [place], [place], [address] Phone: [phone]   Fax:  [phone]  Name: Paige [name] MRN: [id number] Date of Birth: 06/19/1950

## 2017-08-10 ENCOUNTER — Encounter: Payer: Self-pay | Admitting: Physical Therapy

## 2017-08-10 ENCOUNTER — Ambulatory Visit: Payer: BLUE CROSS/BLUE SHIELD | Admitting: Physical Therapy

## 2017-08-10 DIAGNOSIS — M6281 Muscle weakness (generalized): Secondary | ICD-10-CM

## 2017-08-10 DIAGNOSIS — M545 Low back pain: Secondary | ICD-10-CM

## 2017-08-10 DIAGNOSIS — R29898 Other symptoms and signs involving the musculoskeletal system: Secondary | ICD-10-CM

## 2017-08-10 NOTE — Therapy (Signed)
[facility] [address]  [address] [place], [place], [address] Phone: [phone]   Fax:  [phone]  Physical Therapy Treatment  Patient Details  Name: Paige BackMaxine [name] MRN: [id number] Date of Birth: 03/30/1951 Referring Provider: Dr. Ranee Gosselinonald Gioffre   Encounter Date: 08/10/2017  PT End of Session - 08/10/17 0936    Visit Number  7    Number of Visits  12    Date for PT Re-Evaluation  09/02/17    PT Start Time  0934    PT Stop Time  1013    PT Time Calculation (min)  39 min    Activity Tolerance  Patient tolerated treatment well    Behavior During Therapy  [facility] for tasks assessed/performed       Past Medical History:  Diagnosis Date  . Diabetes mellitus without complication (HCC)   . High cholesterol   . Hypertension     Past Surgical History:  Procedure Laterality Date  . ABDOMINAL HYSTERECTOMY      There were no vitals filed for this visit.  Subjective Assessment - 08/10/17 0935    Subjective  Doing well - reports pain at B hips    Pertinent History  HTN, DM    Diagnostic tests  xray and MRI - no fracture per patient, otherwise unsure    Patient Stated Goals  increase LE strength, reduce further falls    Currently in Pain?  Yes    Pain Score  6     Pain Location  Hip and buttock    Pain Orientation  Right;Left    Pain Descriptors / Indicators  Aching;Sore    Pain Type  Acute pain                      [facility] Adult PT Treatment/Exercise - 08/10/17 0936      Lumbar Exercises: Stretches   Passive Hamstring Stretch  Right;Left;2 reps;30 seconds    Passive Hamstring Stretch Limitations  seated    Figure 4 Stretch  3 reps;30 seconds;Seated bilateral      Lumbar Exercises: Aerobic   Nustep  L5 x 6 min      Lumbar Exercises: Standing   Functional Squats  15 reps;3 seconds    Functional Squats Limitations  TRX    Other Standing Lumbar Exercises   hip drop x 15 reps each side (glute med)    Other Standing Lumbar Exercises  B hip abduction and extension - red tband x 15 reps      Lumbar Exercises: Prone   Other Prone Lumbar Exercises  childs pose 2 x 30 sec      Manual Therapy   Manual therapy comments  self-massage to B glute regions with ball against wall               PT Short Term Goals - 08/03/17 0940      PT SHORT TERM GOAL #1   Title  patient to be independent with initial HEP    Status  Achieved        PT Long Term Goals - 07/25/17 1636      PT LONG TERM GOAL #1   Title  Patient to be independent with advanced HEP    Status  On-going      PT LONG TERM GOAL #2   Title  patient to improve lumbar AROM to WNL without pain limiting    Status  On-going  PT LONG TERM GOAL #3   Title  patient to improve B LE strength to >/= 4+/5    Status  On-going      PT LONG TERM GOAL #4   Title  patient to demonstrate good posture and body mechanics as related to her daily activities    Status  On-going      PT LONG TERM GOAL #5   Title  patient to report ability to perform ADLs and light household chores without pain limiting    Status  On-going            Plan - 08/10/17 0936    Clinical Impression Statement  Reports of muscular soreness at B hips today - resolved with self-massage with ball to this area. Patient doing well with all strengthening work with no issue noted. Making good progress towards goals.     PT Treatment/Interventions  ADLs/Self Care Home Management;Cryotherapy;Electrical Stimulation;Moist Heat;Traction;Therapeutic exercise;Therapeutic activities;Functional mobility training;Stair training;Ultrasound;Balance training;Neuromuscular re-education;Patient/family education;Manual techniques;Vasopneumatic Device;Taping;Dry needling;Passive range of motion    Consulted and Agree with Plan of Care  Patient       Patient will benefit from skilled therapeutic intervention in order to improve the following deficits and impairments:  Pain,  Decreased strength, Decreased activity tolerance, Difficulty walking, Decreased mobility, Decreased range of motion  Visit Diagnosis: Acute bilateral low [name] pain, with sciatica presence unspecified  Muscle weakness (generalized)  Other symptoms and signs involving the musculoskeletal system     Problem List There are no active problems to display for this patient.    Kipp Laurence, PT, DPT 08/10/17 2:12 PM   [facility] [address]  [address] [place], [place], [address] Phone: [phone]   Fax:  [phone]  Name: Paige [name] MRN: [id number] Date of Birth: March 02, 1951

## 2017-08-15 ENCOUNTER — Ambulatory Visit: Payer: BLUE CROSS/BLUE SHIELD

## 2017-08-15 DIAGNOSIS — R29898 Other symptoms and signs involving the musculoskeletal system: Secondary | ICD-10-CM

## 2017-08-15 DIAGNOSIS — M545 Low back pain: Secondary | ICD-10-CM | POA: Diagnosis not present

## 2017-08-15 DIAGNOSIS — M6281 Muscle weakness (generalized): Secondary | ICD-10-CM

## 2017-08-15 NOTE — Therapy (Addendum)
[facility] [place] [address]  [place], [place], [address] Phone: [phone]   Fax:  [phone]  Physical Therapy Treatment  Patient Details  Name: Paige [name] MRN: [id number] Date of Birth: 01-31-1951 Referring Provider: Dr. Latanya Maudlin   Encounter Date: 08/15/2017  PT End of Session - 08/15/17 0945    Visit Number  8    Number of Visits  12    Date for PT Re-Evaluation  09/02/17    PT Start Time  0930    PT Stop Time  1011    PT Time Calculation (min)  41 min    Activity Tolerance  Patient tolerated treatment well    Behavior During Therapy  [facility] for tasks assessed/performed       Past Medical History:  Diagnosis Date  . Diabetes mellitus without complication ([place])   . High cholesterol   . Hypertension     Past Surgical History:  Procedure Laterality Date  . ABDOMINAL HYSTERECTOMY      There were no vitals filed for this visit.  Subjective Assessment - 08/15/17 0945    Subjective  Angellynn primary complaint is B buttocks "ache" which seemed to worsen after riding in car a lot yesterday.  Sees MD later today.      Pertinent History  HTN, DM    Diagnostic tests  xray and MRI - no fracture per patient, otherwise unsure    Patient Stated Goals  increase LE strength, reduce further falls    Currently in Pain?  Yes    Pain Score  4     Pain Location  Buttocks    Pain Orientation  Right;Left    Pain Descriptors / Indicators  Aching;Sore    Pain Type  Acute pain    Pain Onset  More than a month ago    Pain Frequency  Constant    Aggravating Factors   prolonged sitting     Pain Relieving Factors  Stretching     Multiple Pain Sites  No         [facility] PT Assessment - 08/15/17 0952      AROM   AROM Assessment Site  Lumbar    Lumbar Flexion  fingertips to low shin - "stretching in back of calves"    Lumbar Extension  WNL    Lumbar - Right Side Bend  fingertip above joint line - "pulling"     Lumbar  - Left Side Bend  fingertip above joint line - "pulling"    Lumbar - Right Rotation  WFL    Lumbar - Left Rotation  [facility]       Strength   Strength Assessment Site  Hip;Knee    Right/Left Hip  Right;Left    Right Hip Flexion  4/5    Right Hip Extension  4/5    Right Hip External Rotation   4+/5    Right Hip Internal Rotation  4+/5    Right Hip ABduction  4/5    Right Hip ADduction  4/5    Left Hip Flexion  4/5    Left Hip Extension  4/5    Left Hip External Rotation  4+/5    Left Hip Internal Rotation  4+/5    Left Hip ABduction  4/5    Left Hip ADduction  4/5    Right/Left Knee  Right;Left    Right Knee Flexion  4/5    Right Knee Extension  4+/5  Left Knee Flexion  4+/5    Left Knee Extension  4+/5                  [facility] Adult PT Treatment/Exercise - 08/15/17 0951      Self-Care   Self-Care  Other Self-Care Comments    Other Self-Care Comments   Self-ball release to B buttocks on wall x 1 min each       Lumbar Exercises: Stretches   Piriformis Stretch  2 reps;Right;Left;30 seconds    Piriformis Stretch Limitations  seated and supine     Other Lumbar Stretch Exercise  Seated lumbar stretch leaning forward with UE support on lower legs x 30 sec       Lumbar Exercises: Aerobic   Recumbent Bike  Lvl 1, 6 min       Lumbar Exercises: Standing   Forward Lunge  10 reps;3 seconds Cueing to avoid excessive anterior wt. shift     Forward Lunge Limitations  counter     Other Standing Lumbar Exercises  B side stepping with red TB at ankles 2 x 40 ft  with "straight" LE      Manual Therapy   Manual Therapy  Soft tissue mobilization;Myofascial release    Manual therapy comments  B sidelying with LE elevated on bolster     Soft tissue mobilization  STM to B glutes/piriformis; ttp     Myofascial Release  TPR to B piriformis; pt. ttp; thus instructed pt. on self-ball release on wall  Cueing required for proper technique              PT Education - 08/15/17 1015     Education provided  Yes    Education Details  Seated piriformis stretch, Seated lumbar stretch     Person(s) Educated  Patient    Methods  Explanation;Demonstration;Verbal cues;Handout    Comprehension  Verbalized understanding;Returned demonstration;Verbal cues required;Need further instruction       PT Short Term Goals - 08/03/17 0940      PT SHORT TERM GOAL #1   Title  patient to be independent with initial HEP    Status  Achieved        PT Long Term Goals - 08/15/17 0949      PT LONG TERM GOAL #1   Title  Patient to be independent with advanced HEP    Status  Partially Met met for curent       PT LONG TERM GOAL #2   Title  patient to improve lumbar AROM to WNL without pain limiting    Status  Partially Met limited in B side bending       PT LONG TERM GOAL #3   Title  patient to improve B LE strength to >/= 4+/5    Status  Partially Met      PT LONG TERM GOAL #4   Title  patient to demonstrate good posture and body mechanics as related to her daily activities    Status  Achieved Pt. feels her posture and body mechanics with her daily activities have improved.  Has altered mechanics with mopping to avoid pain       PT LONG TERM GOAL #5   Title  patient to report ability to perform ADLs and light household chores without pain limiting    Status  Achieved            Plan - 08/15/17 0947    Clinical Impression Statement  Cheyane primary complaint today  was Buttocks aching pain, which seems to worsen after periods of prolonged walking.  Has not been feeling signficant LBP over this past week and pleased with progress of lower back.  Pt. reporting complete relief following buttocks pain after STM, ball-release on wall, and LE stretching.  Tolerated addition of shallow lunge well today.  Has made good progress toward goals with therapy thus far.     PT Treatment/Interventions  ADLs/Self Care Home Management;Cryotherapy;Electrical Stimulation;Moist  Heat;Traction;Therapeutic exercise;Therapeutic activities;Functional mobility training;Stair training;Ultrasound;Balance training;Neuromuscular re-education;Patient/family education;Manual techniques;Vasopneumatic Device;Taping;Dry needling;Passive range of motion    Consulted and Agree with Plan of Care  Patient       Patient will benefit from skilled therapeutic intervention in order to improve the following deficits and impairments:  Pain, Decreased strength, Decreased activity tolerance, Difficulty walking, Decreased mobility, Decreased range of motion  Visit Diagnosis: Acute bilateral low back pain, with sciatica presence unspecified  Muscle weakness (generalized)  Other symptoms and signs involving the musculoskeletal system     Problem List There are no active problems to display for this patient.   Bess Harvest, PTA 08/15/17 10:31 AM   PHYSICAL THERAPY DISCHARGE SUMMARY  Visits from Start of Care: 8  Current functional level related to goals / functional outcomes: See above - subjective reports of feeling much better; only having pain in buttock which is easily resolved with self massage   Remaining deficits: See above - slight deficits in ROM at lumbar spine and slight reduction in strength   Education / Equipment: HEP  Plan: Patient agrees to discharge.  Patient goals were partially met. Patient is being discharged due to being pleased with the current functional level.  ?????     Lanney Gins, PT, DPT 10/03/17 10:40 AM   [facility] [address]  [place], [place], [address] Phone: [phone]   Fax:  [phone]  Name: Reianna Batdorf MRN: [id number] Date of Birth: 04-02-1951

## 2017-08-17 ENCOUNTER — Ambulatory Visit: Payer: BLUE CROSS/BLUE SHIELD | Admitting: Physical Therapy

## 2017-08-23 ENCOUNTER — Other Ambulatory Visit (HOSPITAL_COMMUNITY): Payer: Self-pay | Admitting: Orthopedic Surgery

## 2017-08-23 DIAGNOSIS — M25512 Pain in left shoulder: Secondary | ICD-10-CM

## 2017-08-25 ENCOUNTER — Ambulatory Visit (HOSPITAL_COMMUNITY)
Admission: RE | Admit: 2017-08-25 | Discharge: 2017-08-25 | Disposition: A | Discharge status: Home / Self Care | Payer: BLUE CROSS/BLUE SHIELD | Source: Ambulatory Visit | Attending: Orthopedic Surgery | Admitting: Orthopedic Surgery

## 2017-08-25 DIAGNOSIS — M25512 Pain in left shoulder: Secondary | ICD-10-CM | POA: Insufficient documentation

## 2019-02-17 IMAGING — MR MR SHOULDER*L* W/O CM
4 of 5 series · 20 of 40 positions shown · non-contrast
Comparison: None.

CLINICAL DATA: Left shoulder pain with painful range of motion.

EXAM:
MRI OF THE LEFT SHOULDER WITHOUT CONTRAST
TECHNIQUE: Multiplanar, multisequence MR imaging of the shoulder was performed.
No intravenous contrast was administered.

[Series 3: T2 fat-sat · axial · 4.0mm · 0.23mm/px · z∈[-95,-2]mm · 7 of 23 slices shown (1 of 3)]
[im 1/23]
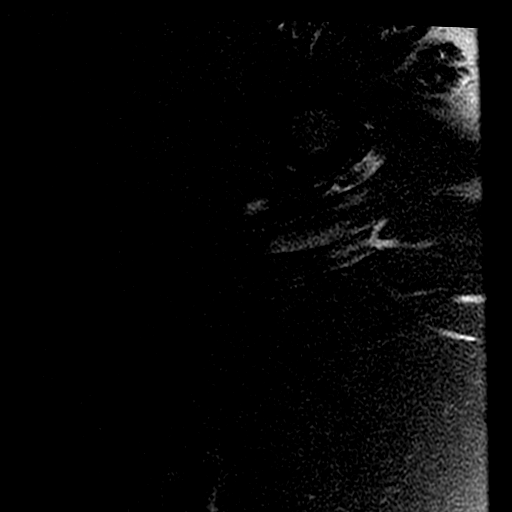
[im 3/23]
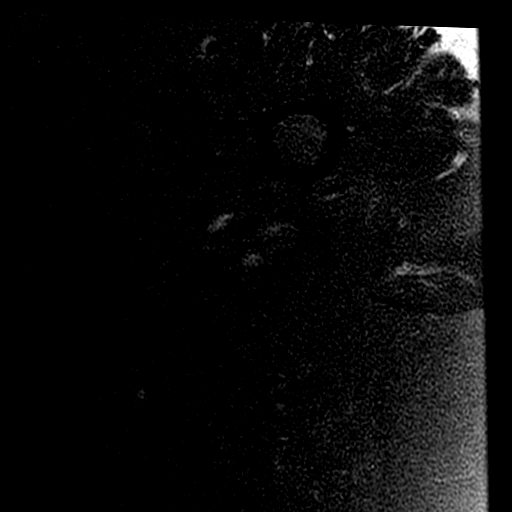
[im 8/23]
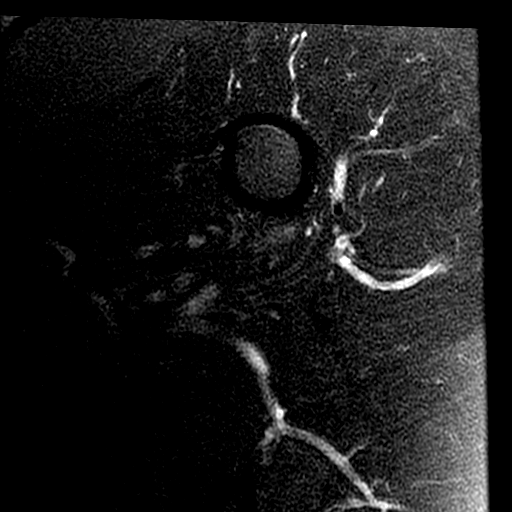
[im 10/23]
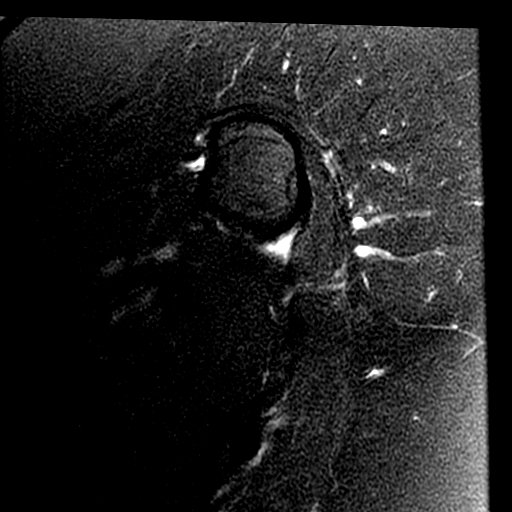
[im 13/23]
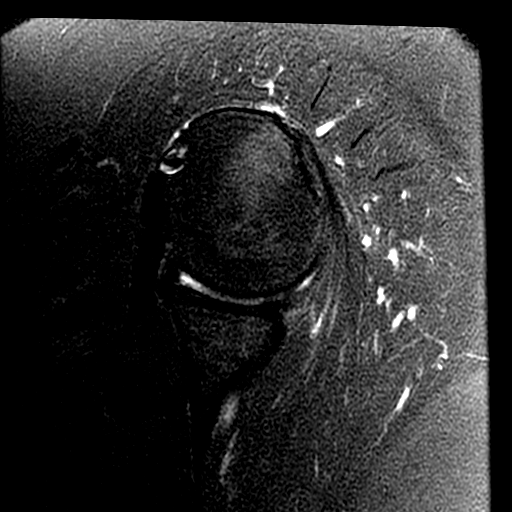
[im 15/23]
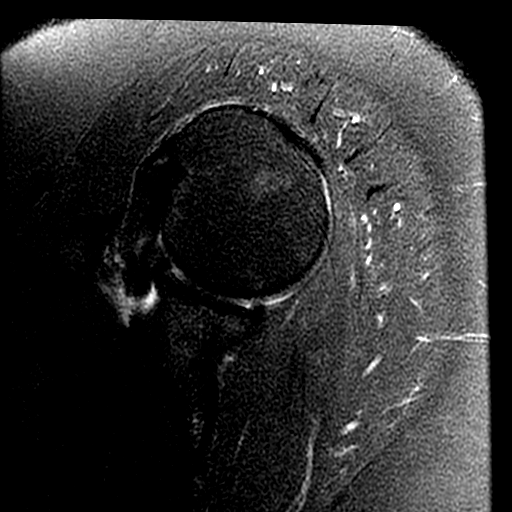
[im 20/23]
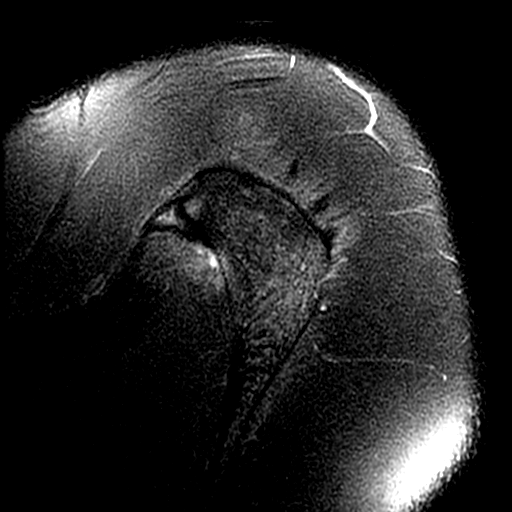

[Series 4: T2 fat-sat · oblique · 4.0mm · 0.29mm/px · 3 of 18 slices shown (2 of 3)]
[im 3/18]
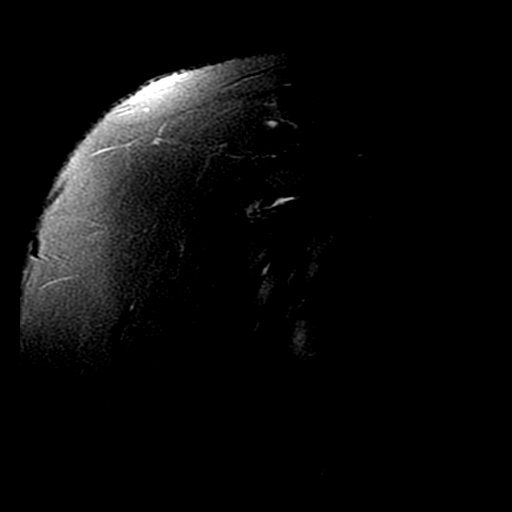
[im 9/18]
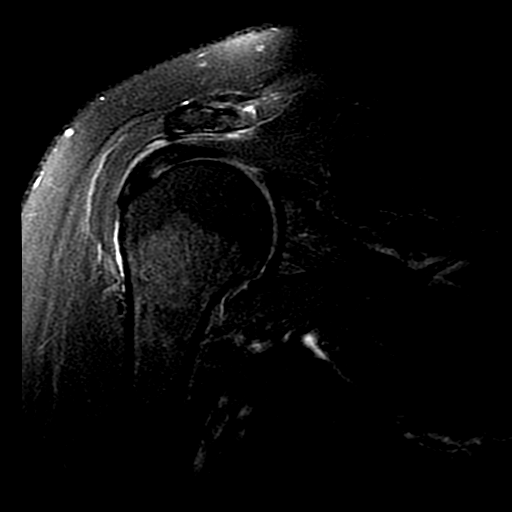
[im 15/18]
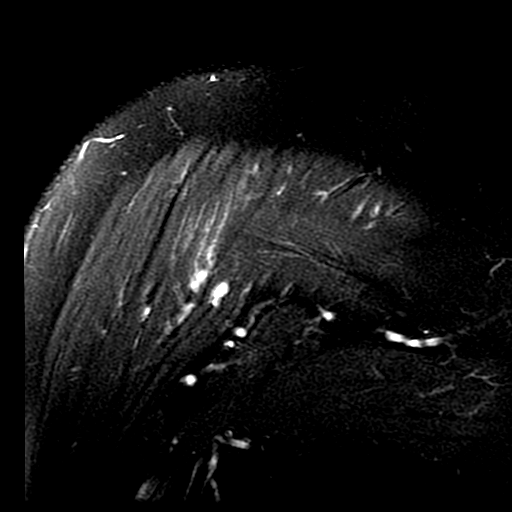

[Series 5: PD · oblique · 4.0mm · 0.29mm/px · 7 of 18 slices shown]
[im 1/18]
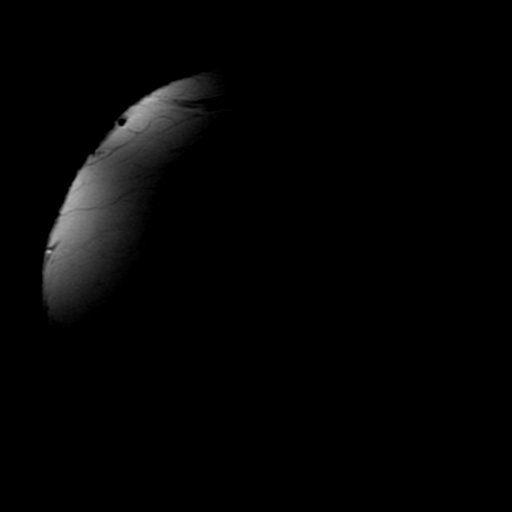
[im 3/18]
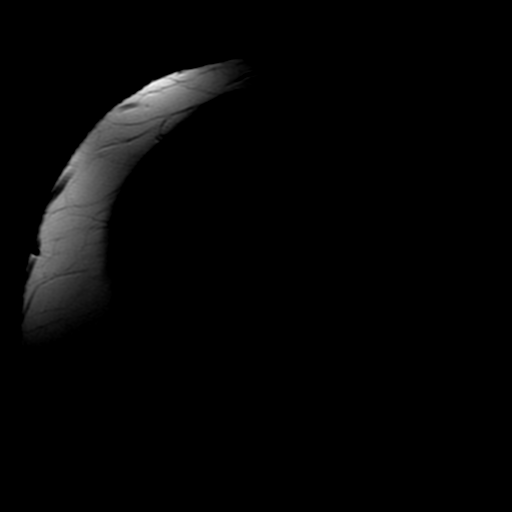
[im 6/18]
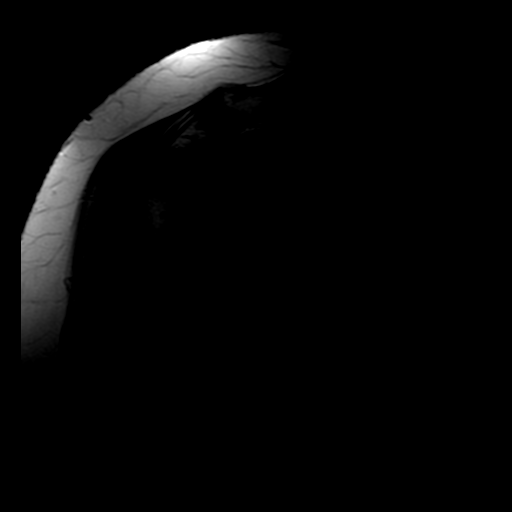
[im 9/18]
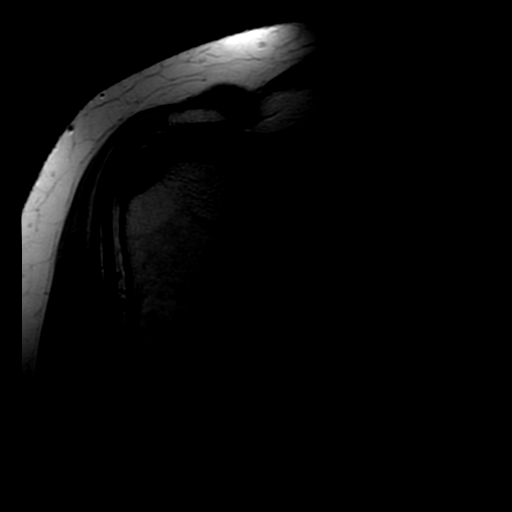
[im 12/18]
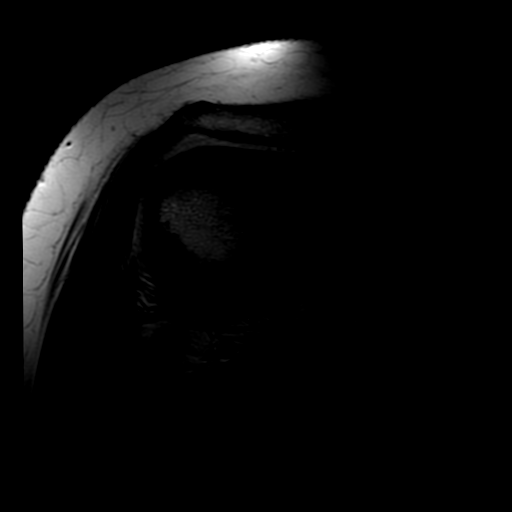
[im 15/18]
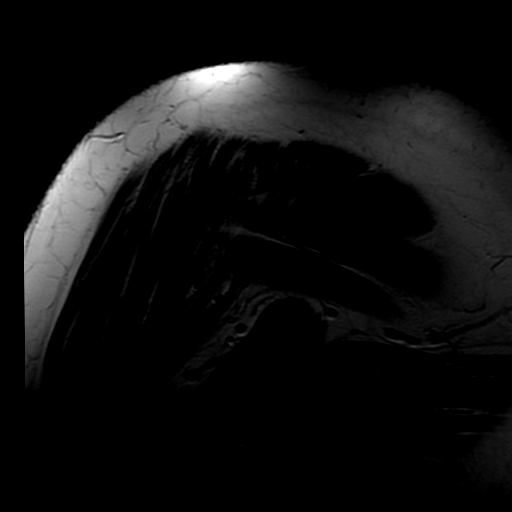
[im 18/18]
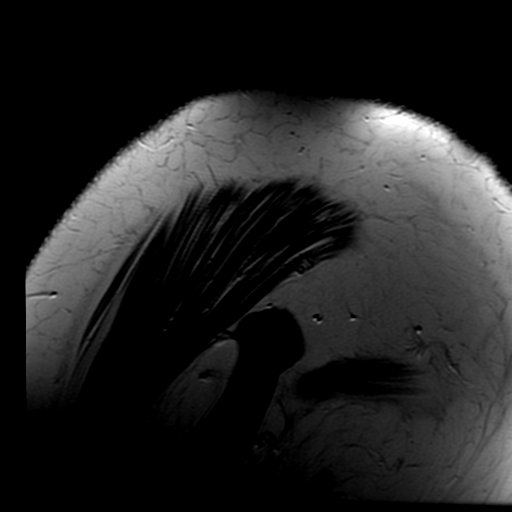

[Series 7: T2 fat-sat · coronal · 4.0mm · 0.29mm/px · 3 of 19 slices shown (3 of 3)]
[im 3/19]
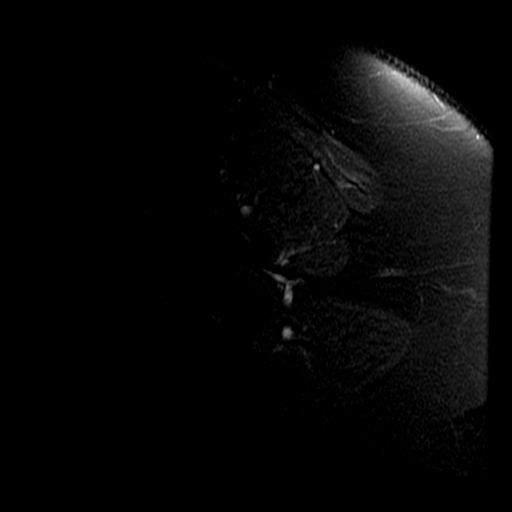
[im 11/19]
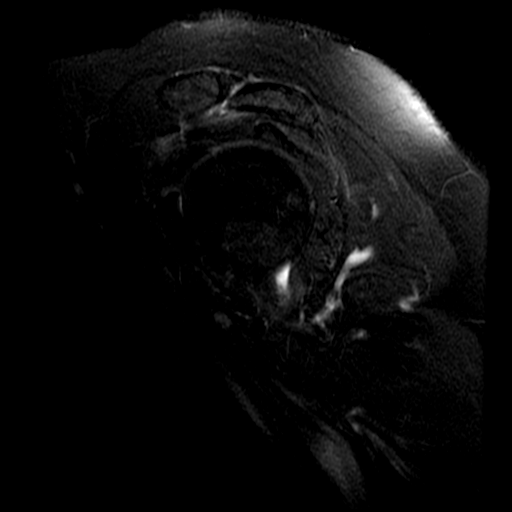
[im 16/19]
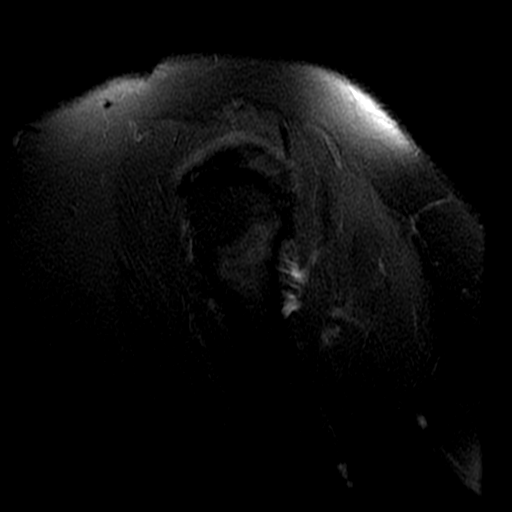

[20 of 40 positions shown; findings below may reference images not displayed]

FINDINGS: Rotator cuff: Intact supraspinatus, infraspinatus, teres minor and
subscapularis tendons.

Muscles: No atrophy or abnormal signal of the muscles of the rotator
cuff.

Biceps long head:  Properly located and intact.

Acromioclavicular Joint:  Normal.  Type 2 acromion. No bursitis

Glenohumeral Joint: Normal.

Labrum:  Intact.

Bones:  Normal.

Other: None
IMPRESSION: Normal MRI of the left shoulder.

## 2019-10-23 ENCOUNTER — Ambulatory Visit: Payer: Self-pay

## 2019-10-23 ENCOUNTER — Ambulatory Visit (INDEPENDENT_AMBULATORY_CARE_PROVIDER_SITE_OTHER): Payer: BC Managed Care – PPO | Admitting: Orthopaedic Surgery

## 2019-10-23 ENCOUNTER — Other Ambulatory Visit: Payer: Self-pay

## 2019-10-23 DIAGNOSIS — G8929 Other chronic pain: Secondary | ICD-10-CM

## 2019-10-23 DIAGNOSIS — M545 Low back pain, unspecified: Secondary | ICD-10-CM

## 2019-10-23 MED ORDER — NABUMETONE 750 MG PO TABS: 750.0000 mg | ORAL_TABLET | Freq: Two times a day (BID) | ORAL | 1 refills | Status: DC | PRN

## 2019-10-23 MED ORDER — TIZANIDINE HCL 4 MG PO TABS: 4.0000 mg | ORAL_TABLET | Freq: Three times a day (TID) | ORAL | 1 refills | Status: AC | PRN

## 2019-10-23 NOTE — Progress Notes (Signed)
Office Visit Note   Patient: Paige [name]           Date of Birth: 05-May-1951           MRN: [id number] Visit Date: 10/23/2019              Requested by: Daphene Calamity, MD [address] [place],  [place] [address] PCP: Daphene Calamity, MD   Assessment & Plan: Visit Diagnoses:  1. Chronic low back pain, unspecified back pain laterality, unspecified whether sciatica present   2. Acute low back pain, unspecified back pain laterality, unspecified whether sciatica present     Plan: I spoke to her in length about her lumbar spine.  The most important thing she can do is lose weight and work on core strengthening and that will help her back.  We will send her to outpatient physical therapy and I will try a course of anti-inflammatories and muscle relaxant.  All questions and concerns were answered and addressed.  We can see her back in about 6 weeks to see how she is doing overall.  Follow-Up Instructions: Return in about 6 weeks (around 12/04/2019).   Orders:  Orders Placed This Encounter  Procedures  . XR Lumbar Spine 2-3 Views   Meds ordered this encounter  Medications  . tiZANidine (ZANAFLEX) 4 MG tablet    Sig: Take 1 tablet (4 mg total) by mouth every 8 (eight) hours as needed for muscle spasms.    Dispense:  60 tablet    Refill:  1  . nabumetone (RELAFEN) 750 MG tablet    Sig: Take 1 tablet (750 mg total) by mouth 2 (two) times daily as needed.    Dispense:  60 tablet    Refill:  1      Procedures: No procedures performed   Clinical Data: No additional findings.   Subjective: Chief Complaint  Patient presents with  . Lower Back - Pain  The patient comes in today as a new patient with chronic low back pain.  This is been going on for a long period time and she actually saw another orthopedic spine surgeon in town for this.  She is diabetic but reports good control.  She is significantly obese and reports pain across her lower lumbar spine that does  radiate into her legs.  She also reports occasional left hand numbness.  She says is worse first thing in the morning.  She denies any change in bowel or bladder function.  She denies any acute change in medical status.  She denies ever have any type of interventions for her spine such as surgery or injections.  She came to see me since we took care of her daughter recently.  She walks without assistive device.  She states that she is trying to work on weight loss.  HPI  Review of Systems She currently denies any headache, chest pain, shortness of breath, fever, chills, nausea, vomiting  Objective: Vital Signs: There were no vitals taken for this visit.  Physical Exam She is alert and orient x3 and in no acute distress Ortho Exam Examination of her lumbar spine shows she has significant truncal obesity.  She has limited flexion extension of the lumbar spine secondary to pain and her obesity.  She has great strength which is normal in her bilateral extremities and no sensory deficits. Specialty Comments:  No specialty comments available.  Imaging: XR Lumbar Spine 2-3 Views  Result Date: 10/23/2019 2 views of the lumbar spine show  no acute findings.  There is disc space narrowing most evident at L5-S1.    PMFS History: There are no problems to display for this patient.  Past Medical History:  Diagnosis Date  . Diabetes mellitus without complication (HCC)   . High cholesterol   . Hypertension     No family history on file.  Past Surgical History:  Procedure Laterality Date  . ABDOMINAL HYSTERECTOMY     Social History   Occupational History  . Not on file  Tobacco Use  . Smoking status: Current Every Day Smoker  . Smokeless tobacco: Never Used  Substance and Sexual Activity  . Alcohol use: Not on file  . Drug use: Not on file  . Sexual activity: Not on file

## 2019-10-31 ENCOUNTER — Ambulatory Visit: Payer: BC Managed Care – PPO | Admitting: Physical Therapy

## 2019-11-27 ENCOUNTER — Ambulatory Visit: Payer: BC Managed Care – PPO | Attending: Orthopaedic Surgery | Admitting: Physical Therapy

## 2019-12-04 ENCOUNTER — Ambulatory Visit: Payer: BC Managed Care – PPO | Admitting: Orthopaedic Surgery

## 2020-01-14 ENCOUNTER — Other Ambulatory Visit: Payer: Self-pay | Admitting: Orthopaedic Surgery

## 2020-01-14 NOTE — Telephone Encounter (Signed)
Patient called requesting refill on Relafen 750mg  tablet. She would like it sent to [facility] at [facility] and [facility] in [address].  CB for patient is [phone]  Attaching patient request to request sent by pharmacy.

## 2020-03-19 ENCOUNTER — Other Ambulatory Visit: Payer: Self-pay | Admitting: Orthopaedic Surgery

## 2022-09-28 ENCOUNTER — Other Ambulatory Visit (HOSPITAL_COMMUNITY): Payer: Self-pay

## 2022-09-28 MED ORDER — MOUNJARO 5 MG/0.5ML ~~LOC~~ SOAJ
5.0000 mg | SUBCUTANEOUS | 0 refills | Status: DC
  Filled 2022-09-28 (×2): qty 2, 28d supply, fill #0

## 2022-09-29 ENCOUNTER — Other Ambulatory Visit (HOSPITAL_COMMUNITY): Payer: Self-pay

## 2022-09-29 MED ORDER — MOUNJARO 7.5 MG/0.5ML ~~LOC~~ SOAJ
7.5000 mg | SUBCUTANEOUS | 1 refills | Status: DC
  Filled 2022-09-29: qty 2, 28d supply, fill #0

## 2022-09-30 ENCOUNTER — Other Ambulatory Visit (HOSPITAL_COMMUNITY): Payer: Self-pay

## 2022-10-14 ENCOUNTER — Other Ambulatory Visit (HOSPITAL_COMMUNITY): Payer: Self-pay

## 2022-10-14 MED ORDER — MOUNJARO 10 MG/0.5ML ~~LOC~~ SOPN
10.0000 mg | PEN_INJECTOR | SUBCUTANEOUS | 2 refills | Status: AC
Start: 2022-10-14 — End: ?
  Filled 2022-10-14 (×2): qty 2, 28d supply, fill #0

## 2022-10-15 ENCOUNTER — Other Ambulatory Visit (HOSPITAL_COMMUNITY): Payer: Self-pay

## 2022-11-16 ENCOUNTER — Other Ambulatory Visit (HOSPITAL_COMMUNITY): Payer: Self-pay

## 2022-11-16 MED ORDER — MOUNJARO 12.5 MG/0.5ML ~~LOC~~ SOAJ
SUBCUTANEOUS | 2 refills | Status: AC
  Filled 2022-11-16: qty 2, 28d supply, fill #0

## 2022-12-06 ENCOUNTER — Other Ambulatory Visit (HOSPITAL_COMMUNITY): Payer: Self-pay
